# Patient Record
Sex: Female | Born: 1990 | Race: White | Hispanic: No | Marital: Single | Smoking: Current every day smoker
Health system: Southern US, Community
[De-identification: ages and names within clinical notes are randomized; demographics above are authoritative.]

---

## 2017-12-22 ENCOUNTER — Emergency Department (HOSPITAL_COMMUNITY)
Admission: EM | Admit: 2017-12-22 | Discharge: 2017-12-22 | Disposition: A | Discharge status: Home / Self Care | Payer: No Typology Code available for payment source | Attending: Emergency Medicine | Admitting: Emergency Medicine

## 2017-12-22 ENCOUNTER — Emergency Department (HOSPITAL_COMMUNITY): Payer: No Typology Code available for payment source

## 2017-12-22 ENCOUNTER — Encounter (HOSPITAL_COMMUNITY): Payer: Self-pay | Admitting: *Deleted

## 2017-12-22 DIAGNOSIS — S51812A Laceration without foreign body of left forearm, initial encounter: Secondary | ICD-10-CM | POA: Diagnosis not present

## 2017-12-22 DIAGNOSIS — S41112A Laceration without foreign body of left upper arm, initial encounter: Secondary | ICD-10-CM

## 2017-12-22 DIAGNOSIS — Y990 Civilian activity done for income or pay: Secondary | ICD-10-CM | POA: Diagnosis not present

## 2017-12-22 DIAGNOSIS — Y9289 Other specified places as the place of occurrence of the external cause: Secondary | ICD-10-CM | POA: Diagnosis not present

## 2017-12-22 DIAGNOSIS — F17200 Nicotine dependence, unspecified, uncomplicated: Secondary | ICD-10-CM | POA: Insufficient documentation

## 2017-12-22 DIAGNOSIS — W25XXXA Contact with sharp glass, initial encounter: Secondary | ICD-10-CM | POA: Insufficient documentation

## 2017-12-22 DIAGNOSIS — S59912A Unspecified injury of left forearm, initial encounter: Secondary | ICD-10-CM | POA: Diagnosis present

## 2017-12-22 DIAGNOSIS — Y939 Activity, unspecified: Secondary | ICD-10-CM | POA: Insufficient documentation

## 2017-12-22 DIAGNOSIS — Z23 Encounter for immunization: Secondary | ICD-10-CM | POA: Diagnosis not present

## 2017-12-22 MED ORDER — LIDOCAINE HCL (PF) 1 % IJ SOLN
5.0000 mL | Freq: Once | INTRAMUSCULAR | Status: AC
  Administered 2017-12-22: 5 mL
  Filled 2017-12-22: qty 30

## 2017-12-22 MED ORDER — TETANUS-DIPHTH-ACELL PERTUSSIS 5-2.5-18.5 LF-MCG/0.5 IM SUSP
0.5000 mL | Freq: Once | INTRAMUSCULAR | Status: AC
  Administered 2017-12-22: 0.5 mL via INTRAMUSCULAR
  Filled 2017-12-22: qty 0.5

## 2017-12-22 NOTE — Discharge Instructions (Signed)
You were evaluated today for laceration repair.  You have sutures that will need to be removed in 8 to 10 days.  Please help with primary care, urgent care or return to the emergency department for removal of the sutures.  We have updated your tetanus in the department.  Return to the ED for new or worsening symptoms.

## 2017-12-22 NOTE — ED Notes (Signed)
Pt tolerated suturing well.  Dressing applied by PA.  DC instructions reviewed by pt, pt ambulating well at DC to go home independently.

## 2017-12-22 NOTE — ED Notes (Signed)
Wound irrigated with normal saline.  Pt tolerated well.  Provider at bedside to suture.
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
## 2017-12-22 NOTE — ED Notes (Signed)
Bed: ZO10 Expected date:  Expected time:  Means of arrival:  Comments: HOLD
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
## 2017-12-31 ENCOUNTER — Emergency Department (HOSPITAL_COMMUNITY)
Admission: EM | Admit: 2017-12-31 | Discharge: 2017-12-31 | Disposition: A | Discharge status: Home / Self Care | Payer: No Typology Code available for payment source | Attending: Emergency Medicine | Admitting: Emergency Medicine

## 2017-12-31 ENCOUNTER — Other Ambulatory Visit: Payer: Self-pay

## 2017-12-31 ENCOUNTER — Encounter (HOSPITAL_COMMUNITY): Payer: Self-pay | Admitting: Emergency Medicine

## 2017-12-31 DIAGNOSIS — Z4802 Encounter for removal of sutures: Secondary | ICD-10-CM | POA: Insufficient documentation

## 2017-12-31 DIAGNOSIS — F17210 Nicotine dependence, cigarettes, uncomplicated: Secondary | ICD-10-CM | POA: Diagnosis not present

## 2017-12-31 NOTE — Discharge Instructions (Addendum)
You have been diagnosed today with visit for suture removal.  At this time there does not appear to be the presence of an emergent medical condition, however there is always the potential for conditions to change. Please read and follow the below instructions.  Please return to the Emergency Department immediately for any new or worsening symptoms or if your symptoms do not improve. Please be sure to follow up with your Primary Care Provider within one week regarding your visit today; please call their office to schedule an appointment even if you are feeling better for a follow-up visit. Please keep the area covered, use clean soap and water to wash daily and monitor for signs of infection.  Keep the area covered while in the sun to minimize scarring.  Seek medical assistance immediately if: You have increasing redness, swelling, or pain in the wound. You see pus coming from the wound. You have a fever. You notice a bad smell coming from the wound or dressing. Your wound breaks open (edges not staying together).  Please read the additional information packets attached to your discharge summary.

## 2017-12-31 NOTE — ED Provider Notes (Signed)
[facility]-EMERGENCY DEPT Provider Note   CSN: [id number] Arrival date & time: 12/31/17  1311     History   Chief Complaint Chief Complaint  Patient presents with  . Suture / Staple Removal    HPI Tiffany Chan is a 27 y.o. female presenting today for suture removal.  Patient was seen on 12/22/2017 for laceration to the left forearm.  Wound was cleaned and sutured at that time, 3 Prolene sutures.  Patient given Tdap shot and return precautions.  Patient states that she has been monitoring the area, keeping it covered and cleaning with soap and water daily.  Patient states that she has had no pain to the area, no erythema or streaking or drainage.  Patient states that she is very happy with the laceration repair performed.  Patient without any other complaints/concerns at this time.  HPI  History reviewed. No pertinent past medical history.  There are no active problems to display for this patient.   History reviewed. No pertinent surgical history.   OB History   None      Home Medications    Prior to Admission medications   Not on File    Family History History reviewed. No pertinent family history.  Social History Social History   Tobacco Use  . Smoking status: Current Every Day Smoker    Packs/day: 0.50  . Smokeless tobacco: Never Used  Substance Use Topics  . Alcohol use: Yes    Comment: " a few drinks a week"  . Drug use: Never     Allergies   Patient has no known allergies.   Review of Systems Review of Systems  Constitutional: Negative.  Negative for chills and fever.  Musculoskeletal: Negative.  Negative for arthralgias, joint swelling and myalgias.  Skin: Positive for wound. Negative for color change (No erythema, drainage or streaking) and rash.   Physical Exam Updated Vital Signs BP (!) 157/71 (BP Location: Left Arm)   Pulse 79   Temp 99 F (37.2 C) (Oral)   Resp 16   Ht 5\' 6"  (1.676 m)   Wt 89.8 kg   LMP  12/01/2017   SpO2 99%   BMI 31.96 kg/m   Physical Exam  Constitutional: She is oriented to person, place, and time. She appears well-developed and well-nourished. No distress.  HENT:  Head: Normocephalic and atraumatic.  Right Ear: External ear normal.  Left Ear: External ear normal.  Nose: Nose normal.  Eyes: Pupils are equal, round, and reactive to light. EOM are normal.  Neck: Trachea normal and normal range of motion. No tracheal deviation present.  Pulmonary/Chest: Effort normal. No respiratory distress.  Abdominal: Soft. There is no tenderness. There is no rebound and no guarding.  Musculoskeletal: Normal range of motion.       Left elbow: Normal.       Left wrist: Normal.       Left upper arm: Normal.       Left forearm: She exhibits laceration. She exhibits no tenderness, no bony tenderness, no swelling, no edema and no deformity.       Left hand: Normal. She exhibits normal capillary refill. Normal sensation noted. Normal strength noted.  Neurological: She is alert and oriented to person, place, and time.  Speech is clear and goal oriented, follows commands Major Cranial nerves without deficit, no facial droop Sensation normal to light touch Normal gait  Skin: Skin is warm and dry.     Patient with well healing 1.5 cm  laceration to left forearm.  3 sutures in place.  No surrounding erythema, induration or fluctuance.  No increased warmth to the area.  No streaking present.  No drainage present.  Psychiatric: She has a normal mood and affect. Her behavior is normal.     ED Treatments / Results  Labs (all labs ordered are listed, but only abnormal results are displayed) Labs Reviewed - No data to display  EKG None  Radiology No results found.  Procedures .Suture Removal Date/Time: 12/31/2017 3:18 PM Performed by: Bill SalinasMorelli, Brandon A, PA-C Authorized by: Bill SalinasMorelli, Brandon A, PA-C   Consent:    Consent obtained:  Verbal   Consent given by:  Patient   Risks  discussed:  Bleeding, pain and wound separation Location:    Location:  Upper extremity   Upper extremity location:  Arm   Arm location:  L lower arm Procedure details:    Wound appearance:  No signs of infection, good wound healing and clean   Number of sutures removed:  3   Number of staples removed:  0 Post-procedure details:    Post-removal:  Band-Aid applied   Patient tolerance of procedure:  Tolerated well, no immediate complications   (including critical care time)  Medications Ordered in ED Medications - No data to display   Initial Impression / Assessment and Plan / ED Course  I have reviewed the triage vital signs and the nursing notes.  Pertinent labs & imaging results that were available during my care of the patient were reviewed by me and considered in my medical decision making (see chart for details).    Patient presents to ER for sutureremoval and wound check as above. Procedure tolerated well. Patient is afebrile, vitals normal, no signs of infection. Scar minimization & return precautions given at discharge.   At this time there does not appear to be any evidence of an acute emergency medical condition and the patient appears stable for discharge with appropriate outpatient follow up. Diagnosis was discussed with patient who verbalizes understanding of care plan and is agreeable to discharge. I have discussed return precautions with patient who verbalizes understanding of return precautions. Patient strongly encouraged to follow-up with their PCP. All questions answered.  Note: Portions of this report may have been transcribed using voice recognition software. Every effort was made to ensure accuracy; however, inadvertent computerized transcription errors may still be present. Final Clinical Impressions(s) / ED Diagnoses   Final diagnoses:  Visit for suture removal    ED Discharge Orders    None       Elizabeth PalauMorelli, Brandon A, PA-C 12/31/17 1526    Azalia Bilisampos,  Kevin, MD 01/01/18 [phone]

## 2017-12-31 NOTE — ED Notes (Signed)
Bed: WTR6 Expected date:  Expected time:  Means of arrival:  Comments: 

## 2017-12-31 NOTE — ED Triage Notes (Signed)
Pt here for suture removal on left forearm, placed 11/9.

## 2019-03-10 ENCOUNTER — Ambulatory Visit: Payer: Managed Care, Other (non HMO) | Attending: Internal Medicine

## 2019-03-10 DIAGNOSIS — Z20822 Contact with and (suspected) exposure to covid-19: Secondary | ICD-10-CM

## 2019-03-11 LAB — NOVEL CORONAVIRUS, NAA: SARS-CoV-2, NAA: NOT DETECTED

## 2019-08-29 ENCOUNTER — Other Ambulatory Visit: Payer: Self-pay

## 2019-08-29 ENCOUNTER — Encounter: Payer: Self-pay | Admitting: Internal Medicine

## 2019-08-29 ENCOUNTER — Ambulatory Visit (INDEPENDENT_AMBULATORY_CARE_PROVIDER_SITE_OTHER): Payer: 59 | Admitting: Internal Medicine

## 2019-08-29 VITALS — BP 121/66 | HR 74 | Temp 98.2°F | Ht 67.0 in | Wt 230.9 lb

## 2019-08-29 DIAGNOSIS — R1011 Right upper quadrant pain: Secondary | ICD-10-CM | POA: Diagnosis not present

## 2019-08-29 NOTE — Patient Instructions (Addendum)
Thank you, [name] for allowing [place] to provide your care today. Today we discussed abdominal pain.    I have ordered the following labs for you:   Lab Orders     CMP14 + Anion Gap     CBC no Diff   I will call if any are abnormal. All of your labs can be accessed through "My Chart".  I have place a referrals to none.  I have ordered the following tests: Abdominal Ultrasound   Please follow-up as needed otherwise 6 months  Should you have any questions or concerns please call the internal medicine clinic at [phone].    Dellia Cloud, D.O. [facility]   My Chart Access: [email or web address]   If you have not already done so, please get your COVID 19 vaccine  To schedule an appointment for a COVID vaccine choice any of the following: Go to [email or web address]   Go to [email or web address]                  Call [phone]                                     Call [phone] and select Option 2

## 2019-08-29 NOTE — Progress Notes (Signed)
CC: RUQ abdominal pain  HPI:  Tiffany Chan is a 29 y.o. female with a past medical history stated below and presents today for RUQ abdominal pain. Please see problem based assessment and plan for additional details.  No past medical history on file.  No current outpatient medications on file prior to visit.   No current facility-administered medications on file prior to visit.    No family history on file.  Social History   Socioeconomic History  . Marital status: Single    Spouse name: Not on file  . Number of children: Not on file  . Years of education: Not on file  . Highest education level: Not on file  Occupational History  . Not on file  Tobacco Use  . Smoking status: Current Every Day Smoker    Packs/day: 0.50  . Smokeless tobacco: Never Used  . Tobacco comment: smoking 6-7 cigs per day  Vaping Use  . Vaping Use: Never used  Substance and Sexual Activity  . Alcohol use: Yes    Comment: " a few drinks a week"  . Drug use: Never  . Sexual activity: Not on file  Other Topics Concern  . Not on file  Social History Narrative  . Not on file   Social Determinants of Health   Financial Resource Strain:   . Difficulty of Paying Living Expenses:   Food Insecurity:   . Worried About Programme researcher, broadcasting/film/video in the Last Year:   . Barista in the Last Year:   Transportation Needs:   . Freight forwarder (Medical):   Marland Kitchen Lack of Transportation (Non-Medical):   Physical Activity:   . Days of Exercise per Week:   . Minutes of Exercise per Session:   Stress:   . Feeling of Stress :   Social Connections:   . Frequency of Communication with Friends and Family:   . Frequency of Social Gatherings with Friends and Family:   . Attends Religious Services:   . Active Member of Clubs or Organizations:   . Attends Banker Meetings:   Marland Kitchen Marital Status:   Intimate Partner Violence:   . Fear of Current or Ex-Partner:   . Emotionally Abused:   Marland Kitchen  Physically Abused:   . Sexually Abused:     Review of Systems: ROS negative except for what is noted on the assessment and plan.  Vitals:   08/29/19 1353  BP: 121/66  Pulse: 74  Temp: 98.2 F (36.8 C)  TempSrc: Oral  SpO2: 100%  Weight: 230 lb 14.4 oz (104.7 kg)  Height: 5\' 7"  (1.702 m)     Physical Exam: Physical Exam Constitutional:      Appearance: Normal appearance.  HENT:     Head: Normocephalic and atraumatic.  Eyes:     Extraocular Movements: Extraocular movements intact.  Cardiovascular:     Rate and Rhythm: Normal rate.     Pulses: Normal pulses.     Heart sounds: Normal heart sounds.  Pulmonary:     Effort: Pulmonary effort is normal.     Breath sounds: Normal breath sounds.  Abdominal:     General: Bowel sounds are normal.     Palpations: Abdomen is soft.     Tenderness: There is no abdominal tenderness.  Musculoskeletal:        General: Normal range of motion.     Cervical back: Normal range of motion.     Right lower leg: No edema.  Left lower leg: No edema.  Skin:    General: Skin is warm and dry.  Neurological:     Mental Status: She is alert and oriented to person, place, and time. Mental status is at baseline.  Psychiatric:        Mood and Affect: Mood normal.      Assessment & Plan:   See Encounters Tab for problem based charting.  Patient discussed with Dr. Janeece Agee, D.O. [facility], PGY-2 Pager: [phone], Phone: [phone] Date 08/30/2019 Time 7:47 AM

## 2019-08-30 ENCOUNTER — Encounter: Payer: Self-pay | Admitting: Internal Medicine

## 2019-08-30 DIAGNOSIS — R1011 Right upper quadrant pain: Secondary | ICD-10-CM | POA: Insufficient documentation

## 2019-08-30 LAB — CMP14 + ANION GAP
ALT: 8 IU/L (ref 0–32)
AST: 18 IU/L (ref 0–40)
Albumin/Globulin Ratio: 2 (ref 1.2–2.2)
Albumin: 4.5 g/dL (ref 3.9–5.0)
Alkaline Phosphatase: 73 IU/L (ref 48–121)
Anion Gap: 16 mmol/L (ref 10.0–18.0)
BUN/Creatinine Ratio: 14 (ref 9–23)
BUN: 9 mg/dL (ref 6–20)
Bilirubin Total: 0.6 mg/dL (ref 0.0–1.2)
CO2: 21 mmol/L (ref 20–29)
Calcium: 9.9 mg/dL (ref 8.7–10.2)
Chloride: 101 mmol/L (ref 96–106)
Creatinine, Ser: 0.66 mg/dL (ref 0.57–1.00)
GFR calc Af Amer: 138 mL/min/{1.73_m2} (ref >59)
GFR calc non Af Amer: 120 mL/min/{1.73_m2} (ref >59)
Globulin, Total: 2.3 g/dL (ref 1.5–4.5)
Glucose: 83 mg/dL (ref 65–99)
Potassium: 4.7 mmol/L (ref 3.5–5.2)
Sodium: 138 mmol/L (ref 134–144)
Total Protein: 6.8 g/dL (ref 6.0–8.5)

## 2019-08-30 LAB — CBC
Hematocrit: 40.7 % (ref 34.0–46.6)
Hemoglobin: 13.3 g/dL (ref 11.1–15.9)
MCH: 30.7 pg (ref 26.6–33.0)
MCHC: 32.7 g/dL (ref 31.5–35.7)
MCV: 94 fL (ref 79–97)
Platelets: 370 10*3/uL (ref 150–450)
RBC: 4.33 x10E6/uL (ref 3.77–5.28)
RDW: 12.1 % (ref 11.7–15.4)
WBC: 10.3 10*3/uL (ref 3.4–10.8)

## 2019-08-30 NOTE — Assessment & Plan Note (Addendum)
Patient presents with a year history of intermittent right upper quadrant abdominal pain.  She states that the pain is very intense and radiates to her right shoulder.  She experiences roughly 1-2 episodes of abdominal pain each month.  She believes that there is an association between eating fatty foods such as pizza prior to having these pains.  The pain lasts about 6 hours and she has associated nausea and loss of appetite.  She has tried over-the-counter antacids and ibuprofen with minimal success.  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  She denies significant EtOH use.  She has never had any associated fevers, chills, diarrhea, or myalgias associated with the symptoms.  She was recently seen in urgent care less than a week ago during which she had a pregnancy test that was negative and she denies risk of pregnancy at this time. She is not currently taking nay medications, denies sick contacts, past surgeries or recent travel.   On evaluation the patient does not have any right upper quadrant pain currently.  Her abdomen is soft without evidence of ascites/fluid wave.  There is no stigmata for liver disease present.  Plan: - Ordered CBC and CMP - Ordered RUQ U/S - Follow up in 6 months if labs are normal.

## 2019-09-01 NOTE — Progress Notes (Signed)
Internal Medicine Clinic Attending  Case discussed with Dr. Marchia Bond  At the time of the visit.  We reviewed the resident's history and exam and pertinent patient test results.  I agree with the assessment, diagnosis, and plan of care documented in the resident's note.  Patient with colicky RUQ pain suspicious for possible chronic cholecystitis. Lab work is wnl. If RUQ sono is wnl would consider HIDA scan.

## 2019-09-02 ENCOUNTER — Telehealth: Payer: Self-pay | Admitting: Internal Medicine

## 2019-09-02 NOTE — Telephone Encounter (Signed)
Called patient today about lab results and to inquire if she schedule an abdominal US yet. No answer left a HIPPA compliant message.    Dellia Cloud, D.O. [facility], PGY-2 Pager: [phone], Phone: [phone] Date 09/02/2019 Time 2:45 PM

## 2019-09-08 ENCOUNTER — Telehealth: Payer: Self-pay | Admitting: General Practice

## 2019-09-08 DIAGNOSIS — R1011 Right upper quadrant pain: Secondary | ICD-10-CM

## 2019-09-08 NOTE — Telephone Encounter (Signed)
Pt stated missed a call, I see Dr Marchia Bond contact pt on 09/02/19; pls contact [phone]

## 2019-09-09 ENCOUNTER — Ambulatory Visit (HOSPITAL_COMMUNITY): Payer: Self-pay

## 2019-09-10 NOTE — Telephone Encounter (Signed)
Called patient but unable to reach her. Dr. Marchia Bond wanted to find out if she had scheduled her ultrasound and it looks like she is scheduled to have it done on Friday. One of our colleagues will call her next week to discuss the results and further actions.

## 2019-09-12 ENCOUNTER — Other Ambulatory Visit: Payer: Self-pay

## 2019-09-12 ENCOUNTER — Ambulatory Visit (HOSPITAL_COMMUNITY)
Admission: RE | Admit: 2019-09-12 | Discharge: 2019-09-12 | Disposition: A | Discharge status: Home / Self Care | Payer: 59 | Source: Ambulatory Visit | Attending: Internal Medicine | Admitting: Internal Medicine

## 2019-09-12 DIAGNOSIS — R1011 Right upper quadrant pain: Secondary | ICD-10-CM | POA: Insufficient documentation

## 2019-09-15 NOTE — Telephone Encounter (Signed)
Contacted patient regarding results of the RUQ ultrasound. Informed that there are gallstones, no inflammation, and fatty liver. Informed her that we would like to get a HIDA scan for further evaluation.

## 2019-10-22 ENCOUNTER — Other Ambulatory Visit: Payer: 59

## 2019-10-22 ENCOUNTER — Other Ambulatory Visit: Payer: Self-pay

## 2019-10-22 ENCOUNTER — Other Ambulatory Visit: Payer: Self-pay | Admitting: *Deleted

## 2019-10-22 DIAGNOSIS — Z20822 Contact with and (suspected) exposure to covid-19: Secondary | ICD-10-CM

## 2019-10-24 LAB — SARS-COV-2, NAA 2 DAY TAT

## 2019-10-24 LAB — NOVEL CORONAVIRUS, NAA: SARS-CoV-2, NAA: NOT DETECTED

## 2019-11-13 ENCOUNTER — Ambulatory Visit (HOSPITAL_COMMUNITY): Payer: 59

## 2019-12-03 ENCOUNTER — Ambulatory Visit (HOSPITAL_COMMUNITY): Payer: 59

## 2019-12-25 ENCOUNTER — Other Ambulatory Visit: Payer: Self-pay

## 2019-12-25 ENCOUNTER — Encounter (HOSPITAL_COMMUNITY)
Admission: RE | Admit: 2019-12-25 | Discharge: 2019-12-25 | Disposition: A | Discharge status: Home / Self Care | Payer: 59 | Source: Ambulatory Visit | Attending: Internal Medicine | Admitting: Internal Medicine

## 2019-12-25 DIAGNOSIS — R1011 Right upper quadrant pain: Secondary | ICD-10-CM | POA: Diagnosis present

## 2019-12-25 MED ORDER — TECHNETIUM TC 99M MEBROFENIN IV KIT
5.0000 | PACK | Freq: Once | INTRAVENOUS | Status: AC | PRN
  Administered 2019-12-25: 5 via INTRAVENOUS

## 2020-01-10 ENCOUNTER — Encounter: Payer: Self-pay | Admitting: *Deleted

## 2020-01-10 ENCOUNTER — Ambulatory Visit
Admission: EM | Admit: 2020-01-10 | Discharge: 2020-01-10 | Disposition: A | Discharge status: Home / Self Care | Payer: 59 | Attending: Emergency Medicine | Admitting: Emergency Medicine

## 2020-01-10 ENCOUNTER — Other Ambulatory Visit: Payer: Self-pay

## 2020-01-10 DIAGNOSIS — Z20822 Contact with and (suspected) exposure to covid-19: Secondary | ICD-10-CM | POA: Diagnosis not present

## 2020-01-10 DIAGNOSIS — J069 Acute upper respiratory infection, unspecified: Secondary | ICD-10-CM

## 2020-01-10 MED ORDER — CETIRIZINE HCL 10 MG PO TABS: 10.0000 mg | ORAL_TABLET | Freq: Every day | ORAL | 0 refills | Status: AC

## 2020-01-10 MED ORDER — FLUTICASONE PROPIONATE 50 MCG/ACT NA SUSP: 1.0000 | Freq: Every day | NASAL | 0 refills | Status: AC

## 2020-01-10 MED ORDER — BENZONATATE 100 MG PO CAPS: 100.0000 mg | ORAL_CAPSULE | Freq: Three times a day (TID) | ORAL | 0 refills | Status: AC

## 2020-01-10 NOTE — Discharge Instructions (Addendum)

## 2020-01-10 NOTE — ED Provider Notes (Signed)
[facility]    CSN: [id number] Arrival date & time: 01/10/20  1517      History   Chief Complaint Chief Complaint  Patient presents with  . Cough  . Sore Throat    HPI Tiffany Chan is a 29 y.o. female  Tiffany Chan is a 29 y.o. female here for evaluation of a cough.  The cough is non-productive, without wheezing, dyspnea or hemoptysis and is aggravated by cold air, dust and fumes. Onset of symptoms was 1 day ago, unchanged since that time.  Associated symptoms include postnasal drip and nasal congestion. Patient does not have a history of asthma. Patient has not had recent travel. Patient does have a history of smoking. Patient  has not had a previous chest x-ray. Patient has not had a PPD done.  Also notes 1 month h/o intermittent L ear pressure w/o tinnitus, dizziness, or hearing loss The following portions of the patient's history were reviewed and updated as appropriate: allergies, current medications, past family history, past medical history, past social history, past surgical history and problem list.     History reviewed. No pertinent past medical history.  Patient Active Problem List   Diagnosis Date Noted  . Colicky RUQ abdominal pain 08/30/2019    History reviewed. No pertinent surgical history.  OB History   No obstetric history on file.      Home Medications    Prior to Admission medications   Medication Sig Start Date End Date Taking? Authorizing Provider  benzonatate (TESSALON) 100 MG capsule Take 1 capsule (100 mg total) by mouth every 8 (eight) hours. 01/10/20   Hall-Potvin, [place], PA-C  cetirizine (ZYRTEC ALLERGY) 10 MG tablet Take 1 tablet (10 mg total) by mouth daily. 01/10/20   Hall-Potvin, [place], PA-C  fluticasone (FLONASE) 50 MCG/ACT nasal spray Place 1 spray into both nostrils daily. 01/10/20   Hall-Potvin, [place], PA-C    Family History Family History  Problem Relation Age of Onset  . Healthy Mother     Social  History Social History   Tobacco Use  . Smoking status: Current Every Day Smoker    Packs/day: 0.50    Types: Cigarettes  . Smokeless tobacco: Never Used  . Tobacco comment: smoking 6-7 cigs per day  Vaping Use  . Vaping Use: Never used  Substance Use Topics  . Alcohol use: Yes    Comment: " a few drinks a week"  . Drug use: Never     Allergies   Patient has no known allergies.   Review of Systems Review of Systems  Constitutional: Negative for fatigue and fever.  HENT: Positive for congestion, ear pain and postnasal drip. Negative for dental problem, ear discharge, facial swelling, hearing loss, sinus pain, sore throat, tinnitus, trouble swallowing and voice change.   Eyes: Negative for photophobia, pain and visual disturbance.  Respiratory: Positive for cough. Negative for shortness of breath.   Cardiovascular: Negative for chest pain and palpitations.  Gastrointestinal: Negative for diarrhea and vomiting.  Musculoskeletal: Negative for arthralgias and myalgias.  Neurological: Negative for dizziness and headaches.     Physical Exam Triage Vital Signs ED Triage Vitals  Enc Vitals Group     BP      Pulse      Resp      Temp      Temp src      SpO2      Weight      Height      Head Circumference  Peak Flow      Pain Score      Pain Loc      Pain Edu?      Excl. in GC?    No data found.  Updated Vital Signs BP 133/85   Pulse 67   Temp 97.6 F (36.4 C) (Oral)   Resp 14   LMP 12/17/2019 (Approximate)   SpO2 97%   Visual Acuity Right Eye Distance:   Left Eye Distance:   Bilateral Distance:    Right Eye Near:   Left Eye Near:    Bilateral Near:     Physical Exam Constitutional:      General: She is not in acute distress.    Appearance: She is not ill-appearing or diaphoretic.  HENT:     Head: Normocephalic and atraumatic.     Right Ear: Tympanic membrane and ear canal normal.     Left Ear: Tympanic membrane and ear canal normal.      Mouth/Throat:     Mouth: Mucous membranes are moist.     Pharynx: Oropharynx is clear. Uvula midline. No oropharyngeal exudate, posterior oropharyngeal erythema or uvula swelling.  Eyes:     General: No scleral icterus.    Conjunctiva/sclera: Conjunctivae normal.     Pupils: Pupils are equal, round, and reactive to light.  Neck:     Comments: Trachea midline, negative JVD Cardiovascular:     Rate and Rhythm: Normal rate and regular rhythm.     Heart sounds: No murmur heard.  No gallop.   Pulmonary:     Effort: Pulmonary effort is normal. No respiratory distress.     Breath sounds: No wheezing, rhonchi or rales.  Musculoskeletal:     Cervical back: Neck supple. No tenderness.  Lymphadenopathy:     Cervical: No cervical adenopathy.  Skin:    Capillary Refill: Capillary refill takes less than 2 seconds.     Coloration: Skin is not jaundiced or pale.     Findings: No rash.  Neurological:     General: No focal deficit present.     Mental Status: She is alert and oriented to person, place, and time.      UC Treatments / Results  Labs (all labs ordered are listed, but only abnormal results are displayed) Labs Reviewed  NOVEL CORONAVIRUS, NAA    EKG   Radiology No results found.  Procedures Procedures (including critical care time)  Medications Ordered in UC Medications - No data to display  Initial Impression / Assessment and Plan / UC Course  I have reviewed the triage vital signs and the nursing notes.  Pertinent labs & imaging results that were available during my care of the patient were reviewed by me and considered in my medical decision making (see chart for details).     Patient afebrile, nontoxic, with SpO2 97%.  Covid PCR pending.  Patient to quarantine until results are back.  We will treat supportively as outlined below.  Return precautions discussed, patient verbalized understanding and is agreeable to plan. Final Clinical Impressions(s) / UC Diagnoses    Final diagnoses:  Encounter for laboratory testing for COVID-19 virus  URI with cough and congestion     Discharge Instructions     Tessalon for cough. Start flonase, atrovent nasal spray for nasal congestion/drainage. You can use over the counter nasal saline rinse such as neti pot for nasal congestion. Keep hydrated, your urine should be clear to pale yellow in color. Tylenol/motrin for fever and pain. Monitor for  any worsening of symptoms, chest pain, shortness of breath, wheezing, swelling of the throat, go to the emergency department for further evaluation needed.     ED Prescriptions    Medication Sig Dispense Auth. Provider   cetirizine (ZYRTEC ALLERGY) 10 MG tablet Take 1 tablet (10 mg total) by mouth daily. 30 tablet Hall-Potvin, [place], PA-C   fluticasone (FLONASE) 50 MCG/ACT nasal spray Place 1 spray into both nostrils daily. 16 g Hall-Potvin, [place], PA-C   benzonatate (TESSALON) 100 MG capsule Take 1 capsule (100 mg total) by mouth every 8 (eight) hours. 21 capsule Hall-Potvin, [place], PA-C     PDMP not reviewed this encounter.   Odette Fraction [place], [place] 01/10/20 [phone]

## 2020-01-10 NOTE — ED Triage Notes (Signed)
C/O left ear pressure x 1 month.  Yesterday started with nasal congestion, cough, sore throat without fever.

## 2020-01-11 LAB — SARS-COV-2, NAA 2 DAY TAT

## 2020-01-11 LAB — NOVEL CORONAVIRUS, NAA: SARS-CoV-2, NAA: NOT DETECTED

## 2021-03-29 IMAGING — US US ABDOMEN COMPLETE
1 series · 14 of 25 positions shown · non-contrast
Comparison: None.

CLINICAL DATA: RIGHT upper quadrant pain

EXAM:
ABDOMEN ULTRASOUND COMPLETE

[Series 1: us abdomen complete · 14 of 75 slices shown]
[im 1/75]
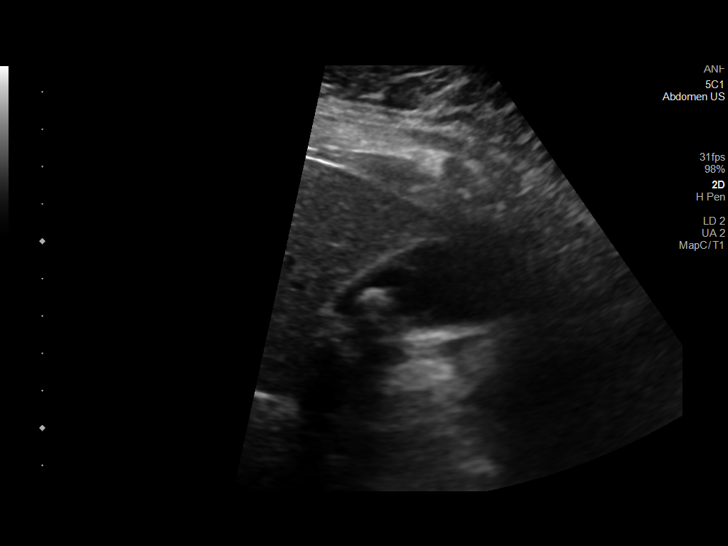
[im 7/75]
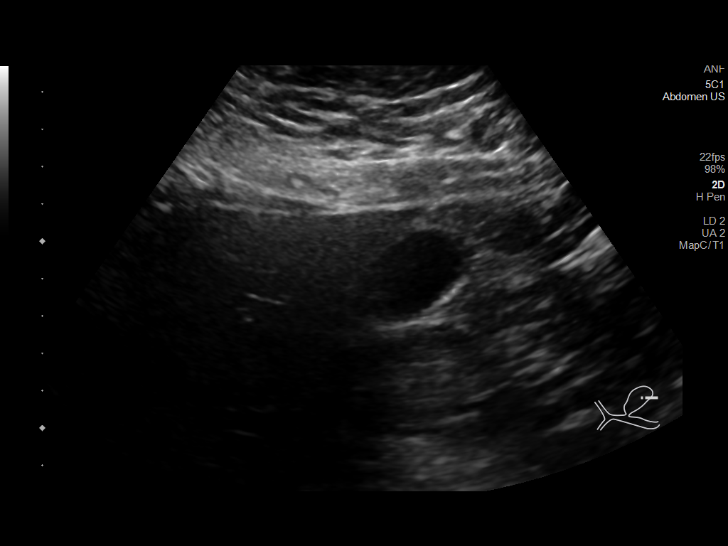
[im 13/75]
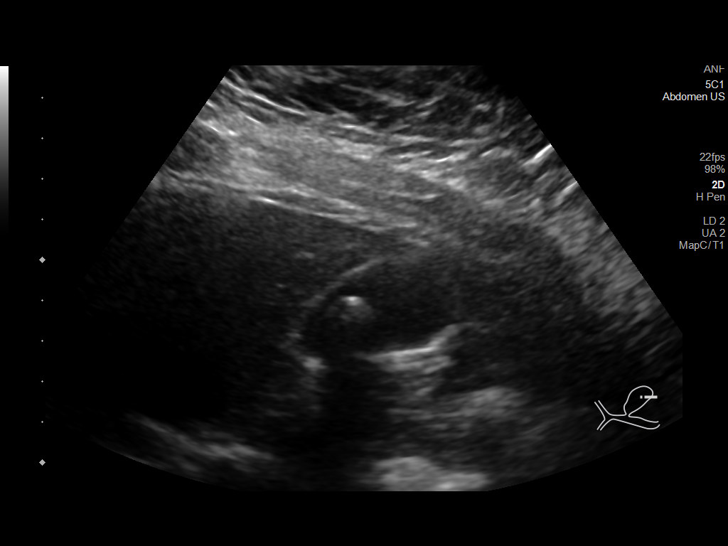
[im 19/75]
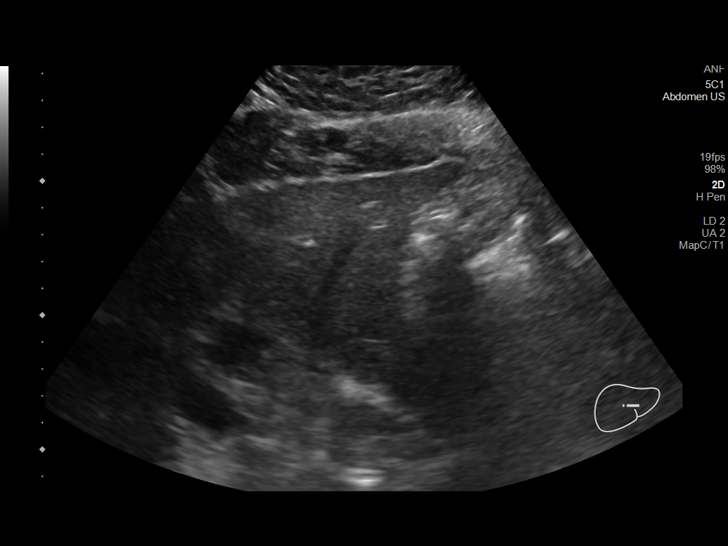
[im 25/75]
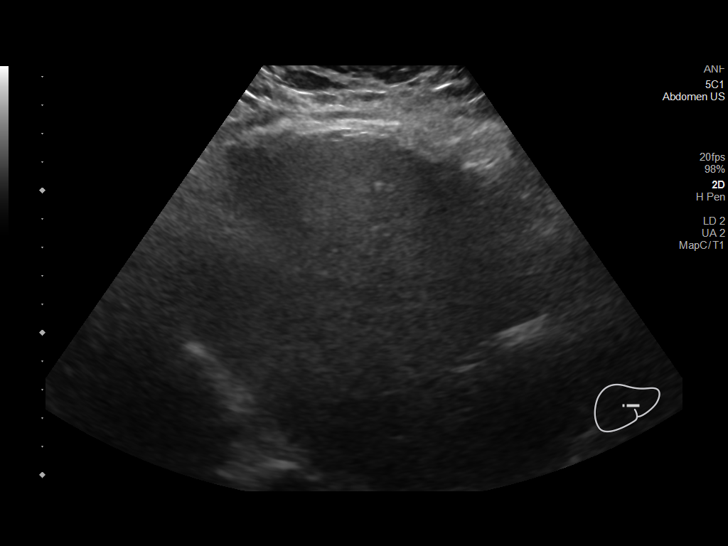
[im 28/75]
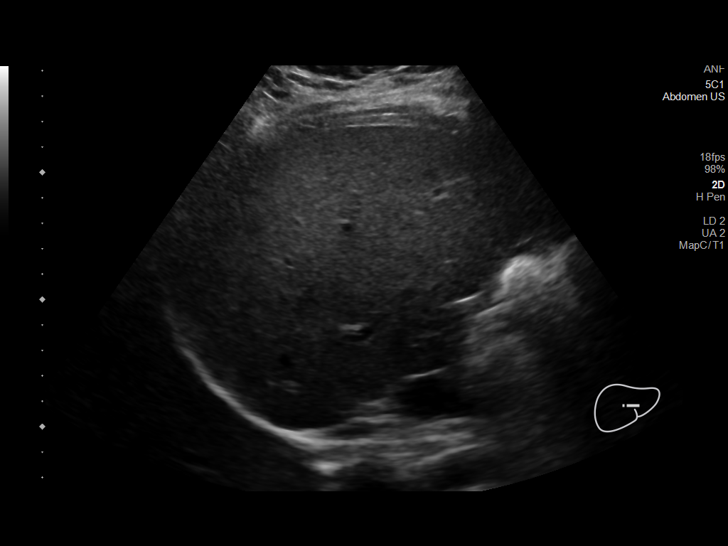
[im 34/75]
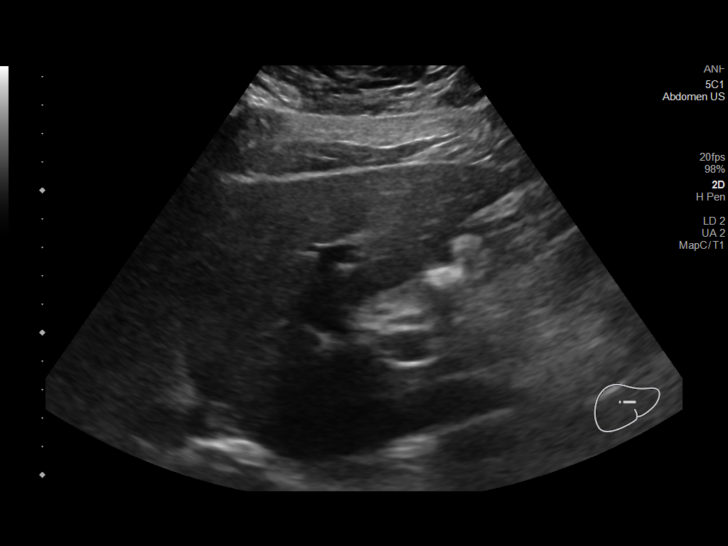
[im 41/75]
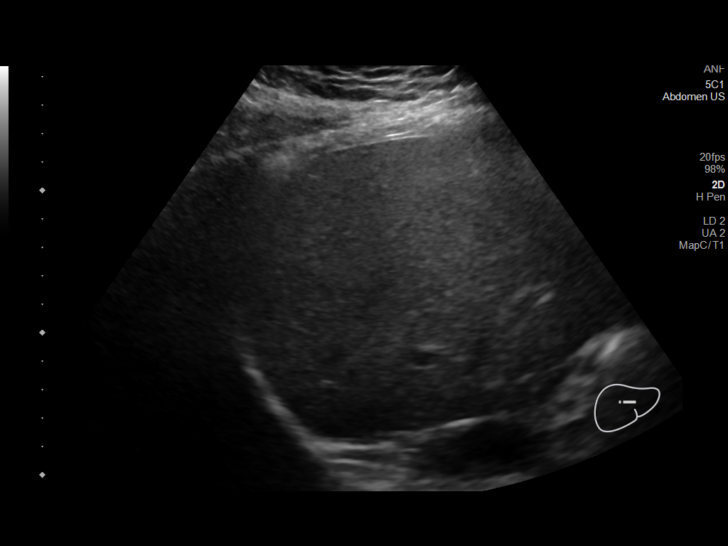
[im 47/75]
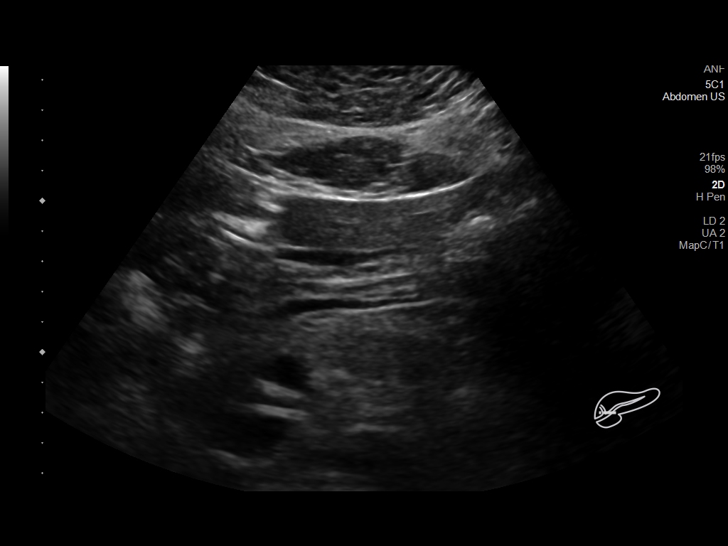
[im 50/75]
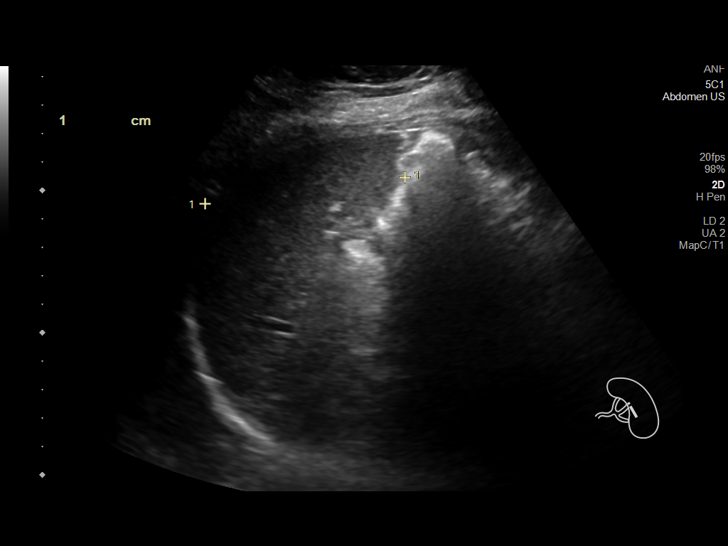
[im 56/75]
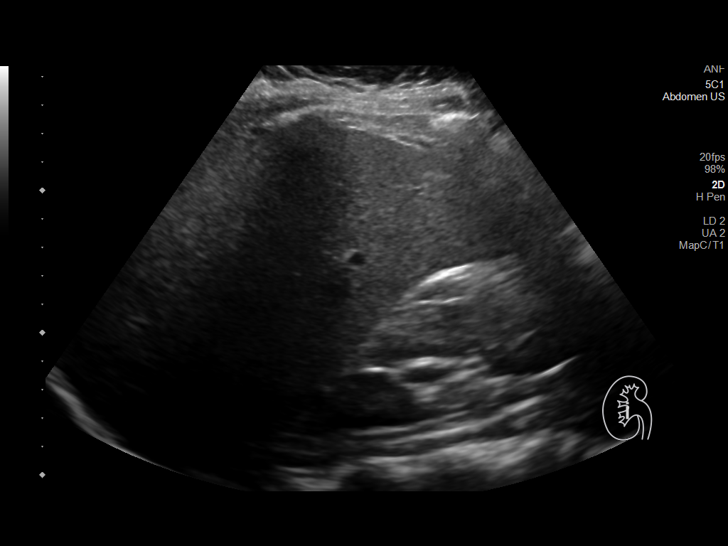
[im 62/75]
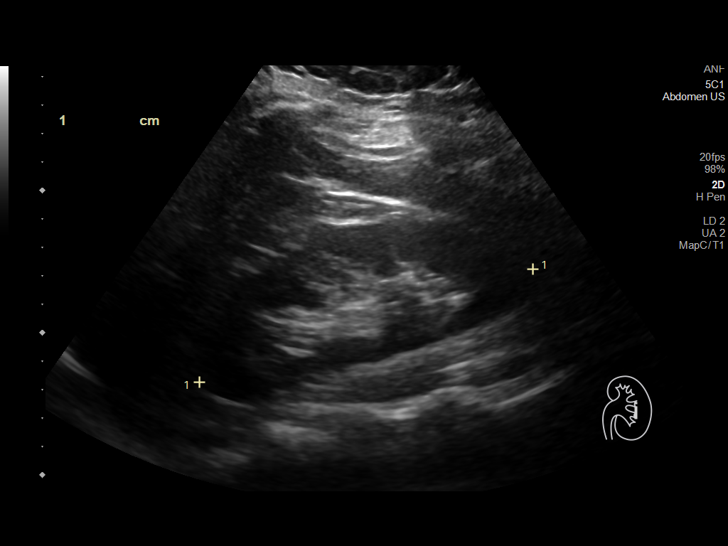
[im 68/75]
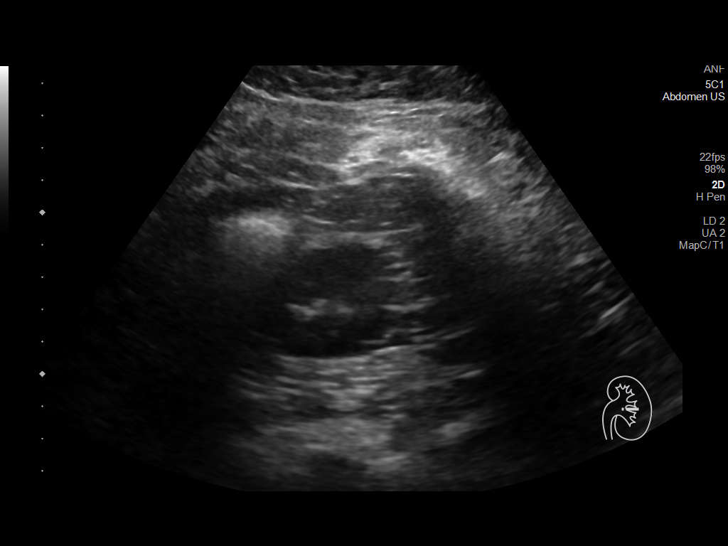
[im 75/75]
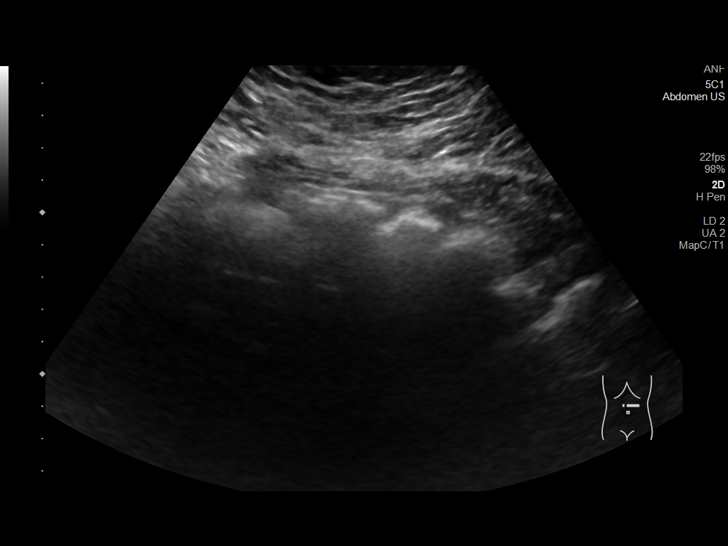

[14 of 25 positions shown; findings below may reference images not displayed]

FINDINGS: Gallbladder: Cholelithiasis. Normal gallbladder wall thickness.
Negative sonographic Murphy sign. Largest gallstone measures up to
1.8 cm.

Common bile duct: Diameter: 2.1 mm, normal

Liver: Mildly increased echogenicity diffusely throughout the liver.
No definitive focal lesion identified. Portal vein is patent on
color Doppler imaging with normal direction of blood flow towards
the liver.

IVC: No abnormality visualized.

Pancreas: Visualized portion unremarkable.

Spleen: Size and appearance within normal limits.

Right Kidney: Length: 11.9 cm. Echogenicity within normal limits. No
mass or hydronephrosis visualized.

Left Kidney: Length: 12.4 cm. Echogenicity within normal limits. No
mass or hydronephrosis visualized.

Abdominal aorta: No aneurysm visualized. Proximal aorta measures up
to 2.2 cm. Mid aorta measures 1.9 cm. Distal aorta measures 1.7 cm.
Bifurcation could not be visualized secondary to body habitus and
shadowing bowel gas.

Other findings: None.
IMPRESSION: 1.  Cholelithiasis without evidence of cholecystitis.

2.  Hepatic steatosis
